# Patient Record
Sex: Female | Born: 1950 | Race: White | Hispanic: No | Marital: Married | Smoking: Never smoker
Health system: Southern US, Community
[De-identification: ages and names within clinical notes are randomized; demographics above are authoritative.]

---

## 2001-05-08 ENCOUNTER — Encounter: Admission: RE | Admit: 2001-05-08 | Discharge: 2001-06-06 | Payer: Self-pay | Admitting: Family Medicine

## 2002-10-02 ENCOUNTER — Ambulatory Visit (HOSPITAL_COMMUNITY): Admission: RE | Admit: 2002-10-02 | Discharge: 2002-10-02 | Payer: Self-pay | Admitting: Gastroenterology

## 2017-04-27 DIAGNOSIS — H5213 Myopia, bilateral: Secondary | ICD-10-CM | POA: Diagnosis not present

## 2017-04-27 DIAGNOSIS — H52229 Regular astigmatism, unspecified eye: Secondary | ICD-10-CM | POA: Diagnosis not present

## 2017-04-27 DIAGNOSIS — H524 Presbyopia: Secondary | ICD-10-CM | POA: Diagnosis not present

## 2017-04-27 DIAGNOSIS — H5203 Hypermetropia, bilateral: Secondary | ICD-10-CM | POA: Diagnosis not present

## 2017-06-01 DIAGNOSIS — R69 Illness, unspecified: Secondary | ICD-10-CM | POA: Diagnosis not present

## 2017-07-29 DIAGNOSIS — Z823 Family history of stroke: Secondary | ICD-10-CM | POA: Diagnosis not present

## 2017-07-29 DIAGNOSIS — Z7722 Contact with and (suspected) exposure to environmental tobacco smoke (acute) (chronic): Secondary | ICD-10-CM | POA: Diagnosis not present

## 2017-07-29 DIAGNOSIS — J309 Allergic rhinitis, unspecified: Secondary | ICD-10-CM | POA: Diagnosis not present

## 2017-07-29 DIAGNOSIS — Z8249 Family history of ischemic heart disease and other diseases of the circulatory system: Secondary | ICD-10-CM | POA: Diagnosis not present

## 2017-07-29 DIAGNOSIS — R030 Elevated blood-pressure reading, without diagnosis of hypertension: Secondary | ICD-10-CM | POA: Diagnosis not present

## 2017-07-29 DIAGNOSIS — K08409 Partial loss of teeth, unspecified cause, unspecified class: Secondary | ICD-10-CM | POA: Diagnosis not present

## 2018-04-26 DIAGNOSIS — H5203 Hypermetropia, bilateral: Secondary | ICD-10-CM | POA: Diagnosis not present

## 2018-04-26 DIAGNOSIS — H5213 Myopia, bilateral: Secondary | ICD-10-CM | POA: Diagnosis not present

## 2018-04-26 DIAGNOSIS — H524 Presbyopia: Secondary | ICD-10-CM | POA: Diagnosis not present

## 2018-04-26 DIAGNOSIS — H52229 Regular astigmatism, unspecified eye: Secondary | ICD-10-CM | POA: Diagnosis not present

## 2018-04-26 DIAGNOSIS — H2510 Age-related nuclear cataract, unspecified eye: Secondary | ICD-10-CM | POA: Diagnosis not present

## 2018-09-18 DIAGNOSIS — M722 Plantar fascial fibromatosis: Secondary | ICD-10-CM | POA: Diagnosis not present

## 2018-09-18 DIAGNOSIS — M7731 Calcaneal spur, right foot: Secondary | ICD-10-CM | POA: Diagnosis not present

## 2018-09-18 DIAGNOSIS — M79671 Pain in right foot: Secondary | ICD-10-CM | POA: Diagnosis not present

## 2018-10-17 DIAGNOSIS — M722 Plantar fascial fibromatosis: Secondary | ICD-10-CM | POA: Diagnosis not present

## 2018-10-17 DIAGNOSIS — M7731 Calcaneal spur, right foot: Secondary | ICD-10-CM | POA: Diagnosis not present

## 2018-10-17 DIAGNOSIS — M79671 Pain in right foot: Secondary | ICD-10-CM | POA: Diagnosis not present

## 2018-11-07 DIAGNOSIS — M7731 Calcaneal spur, right foot: Secondary | ICD-10-CM | POA: Diagnosis not present

## 2018-11-07 DIAGNOSIS — M722 Plantar fascial fibromatosis: Secondary | ICD-10-CM | POA: Diagnosis not present

## 2018-11-07 DIAGNOSIS — M79671 Pain in right foot: Secondary | ICD-10-CM | POA: Diagnosis not present

## 2018-12-12 DIAGNOSIS — M7732 Calcaneal spur, left foot: Secondary | ICD-10-CM | POA: Diagnosis not present

## 2018-12-12 DIAGNOSIS — M79672 Pain in left foot: Secondary | ICD-10-CM | POA: Diagnosis not present

## 2018-12-12 DIAGNOSIS — M722 Plantar fascial fibromatosis: Secondary | ICD-10-CM | POA: Diagnosis not present

## 2018-12-12 DIAGNOSIS — M7731 Calcaneal spur, right foot: Secondary | ICD-10-CM | POA: Diagnosis not present

## 2018-12-12 DIAGNOSIS — M79671 Pain in right foot: Secondary | ICD-10-CM | POA: Diagnosis not present

## 2018-12-14 DIAGNOSIS — M25579 Pain in unspecified ankle and joints of unspecified foot: Secondary | ICD-10-CM | POA: Diagnosis not present

## 2018-12-14 DIAGNOSIS — M7732 Calcaneal spur, left foot: Secondary | ICD-10-CM | POA: Diagnosis not present

## 2018-12-14 DIAGNOSIS — M79671 Pain in right foot: Secondary | ICD-10-CM | POA: Diagnosis not present

## 2018-12-14 DIAGNOSIS — M7731 Calcaneal spur, right foot: Secondary | ICD-10-CM | POA: Diagnosis not present

## 2018-12-14 DIAGNOSIS — M722 Plantar fascial fibromatosis: Secondary | ICD-10-CM | POA: Diagnosis not present

## 2018-12-14 DIAGNOSIS — M79672 Pain in left foot: Secondary | ICD-10-CM | POA: Diagnosis not present

## 2019-01-11 DIAGNOSIS — R69 Illness, unspecified: Secondary | ICD-10-CM | POA: Diagnosis not present

## 2019-02-06 DIAGNOSIS — M7731 Calcaneal spur, right foot: Secondary | ICD-10-CM | POA: Diagnosis not present

## 2019-02-06 DIAGNOSIS — M79671 Pain in right foot: Secondary | ICD-10-CM | POA: Diagnosis not present

## 2019-02-06 DIAGNOSIS — M722 Plantar fascial fibromatosis: Secondary | ICD-10-CM | POA: Diagnosis not present

## 2020-04-07 ENCOUNTER — Other Ambulatory Visit: Payer: Self-pay

## 2020-04-07 ENCOUNTER — Emergency Department (HOSPITAL_COMMUNITY): Payer: Medicare HMO

## 2020-04-07 ENCOUNTER — Encounter (HOSPITAL_COMMUNITY): Payer: Self-pay | Admitting: Emergency Medicine

## 2020-04-07 ENCOUNTER — Emergency Department (HOSPITAL_COMMUNITY)
Admission: EM | Admit: 2020-04-07 | Discharge: 2020-04-07 | Disposition: A | Discharge status: Home / Self Care | Payer: Medicare HMO | Attending: Emergency Medicine | Admitting: Emergency Medicine

## 2020-04-07 DIAGNOSIS — R519 Headache, unspecified: Secondary | ICD-10-CM | POA: Diagnosis not present

## 2020-04-07 DIAGNOSIS — R55 Syncope and collapse: Secondary | ICD-10-CM

## 2020-04-07 DIAGNOSIS — R0981 Nasal congestion: Secondary | ICD-10-CM | POA: Diagnosis not present

## 2020-04-07 DIAGNOSIS — Y92012 Bathroom of single-family (private) house as the place of occurrence of the external cause: Secondary | ICD-10-CM | POA: Diagnosis not present

## 2020-04-07 DIAGNOSIS — Z79899 Other long term (current) drug therapy: Secondary | ICD-10-CM | POA: Insufficient documentation

## 2020-04-07 DIAGNOSIS — Z20822 Contact with and (suspected) exposure to covid-19: Secondary | ICD-10-CM | POA: Insufficient documentation

## 2020-04-07 LAB — BASIC METABOLIC PANEL
Anion gap: 11 (ref 5–15)
BUN: 8 mg/dL (ref 8–23)
CO2: 24 mmol/L (ref 22–32)
Calcium: 9.3 mg/dL (ref 8.9–10.3)
Chloride: 103 mmol/L (ref 98–111)
Creatinine, Ser: 0.74 mg/dL (ref 0.44–1.00)
GFR, Estimated: >60 mL/min (ref >60)
Glucose, Bld: 103 mg/dL — ABNORMAL HIGH (ref 70–99)
Potassium: 3.6 mmol/L (ref 3.5–5.1)
Sodium: 138 mmol/L (ref 135–145)

## 2020-04-07 LAB — CBC
HCT: 42.5 % (ref 36.0–46.0)
Hemoglobin: 13.6 g/dL (ref 12.0–15.0)
MCH: 29.5 pg (ref 26.0–34.0)
MCHC: 32 g/dL (ref 30.0–36.0)
MCV: 92.2 fL (ref 80.0–100.0)
Platelets: 205 10*3/uL (ref 150–400)
RBC: 4.61 MIL/uL (ref 3.87–5.11)
RDW: 13.5 % (ref 11.5–15.5)
WBC: 7.4 10*3/uL (ref 4.0–10.5)
nRBC: 0 % (ref 0.0–0.2)

## 2020-04-07 LAB — SARS CORONAVIRUS 2 (TAT 6-24 HRS): SARS Coronavirus 2: POSITIVE — AB

## 2020-04-07 NOTE — Discharge Instructions (Signed)
As we discussed it was recommended that she be admitted for the unexplained passing out.  At this reoccurs you need to return and need to be reevaluated.  Covid test done.  You will be able to follow-up your results on MyChart.  Return for any passing out return for any new or worse symptoms.

## 2020-04-07 NOTE — ED Provider Notes (Addendum)
[facility] EMERGENCY DEPARTMENT Provider Note   CSN: [id number] Arrival date & time: 04/07/20  7116     History Chief Complaint  Patient presents with  . Loss of Consciousness    Danielle Jensen is a 69 y.o. female.  Patient brought in by POV.  Patient's caregiver.  She felt fine she was up at 4 in the morning no symptoms.  Shortly prior to arrival she walked into the bathroom and's must of passed out because she awoke on the floor.  She denies any complaints now no headache no neck pain no back pain no extremity pain.  No chest pain.  Patient is never had this happen before.  Past medical history noncontributory.  Patient not a smoker.  Patient states yesterday she had a little bit of mild headache and some congestion.  May be slight cough.  But none of that is present today.        History reviewed. No pertinent past medical history.  There are no problems to display for this patient.   History reviewed. No pertinent surgical history.   OB History   No obstetric history on file.     History reviewed. No pertinent family history.  Social History   Tobacco Use  . Smoking status: Never Smoker  . Smokeless tobacco: Never Used  Substance Use Topics  . Alcohol use: Never  . Drug use: Never    Home Medications Prior to Admission medications   Medication Sig Start Date End Date Taking? Authorizing Provider  Ascorbic Acid (VITAMIN C PO) Take 1 tablet by mouth daily.   Yes [provider]  Multiple Vitamins-Minerals (ZINC PO) Take 1 tablet by mouth daily.   Yes [provider]    Allergies    Patient has no allergy information on record.  Review of Systems   Review of Systems  Constitutional: Negative for chills and fever.  HENT: Negative for rhinorrhea and sore throat.   Eyes: Negative for visual disturbance.  Respiratory: Negative for cough and shortness of breath.   Cardiovascular: Negative for chest pain and leg swelling.   Gastrointestinal: Negative for abdominal pain, diarrhea, nausea and vomiting.  Genitourinary: Negative for dysuria.  Musculoskeletal: Negative for back pain and neck pain.  Skin: Negative for rash.  Neurological: Positive for syncope. Negative for dizziness, light-headedness and headaches.  Hematological: Does not bruise/bleed easily.  Psychiatric/Behavioral: Negative for confusion.    Physical Exam Updated Vital Signs BP (!) 147/84   Pulse 84   Temp 98.6 F (37 C) (Oral)   Resp (!) 23   Ht 1.575 m (5\' 2" )   Wt 56.2 kg   SpO2 97%   BMI 22.68 kg/m   Physical Exam Vitals and nursing note reviewed.  Constitutional:      General: She is not in acute distress.    Appearance: Normal appearance. She is well-developed and well-nourished.  HENT:     Head: Normocephalic and atraumatic.  Eyes:     Extraocular Movements: Extraocular movements intact.     Conjunctiva/sclera: Conjunctivae normal.     Pupils: Pupils are equal, round, and reactive to light.  Cardiovascular:     Rate and Rhythm: Normal rate and regular rhythm.     Heart sounds: No murmur heard.   Pulmonary:     Effort: Pulmonary effort is normal. No respiratory distress.     Breath sounds: Normal breath sounds.  Abdominal:     Palpations: Abdomen is soft.     Tenderness: There is  no abdominal tenderness.  Musculoskeletal:        General: No edema. Normal range of motion.     Cervical back: Normal range of motion and neck supple.  Skin:    General: Skin is warm and dry.     Capillary Refill: Capillary refill takes less than 2 seconds.  Neurological:     General: No focal deficit present.     Mental Status: She is alert and oriented to person, place, and time.     Cranial Nerves: No cranial nerve deficit.     Sensory: No sensory deficit.     Motor: No weakness.  Psychiatric:        Mood and Affect: Mood and affect normal.     ED Results / Procedures / Treatments   Labs (all labs ordered are listed, but  only abnormal results are displayed) Labs Reviewed  BASIC METABOLIC PANEL - Abnormal; Notable for the following components:      Result Value   Glucose, Bld 103 (*)    All other components within normal limits  SARS CORONAVIRUS 2 (TAT 6-24 HRS)  CBC  URINALYSIS, ROUTINE W REFLEX MICROSCOPIC    EKG EKG Interpretation  Date/Time:  Monday April 07 2020 08:27:12 EST Ventricular Rate:  84 PR Interval:    QRS Duration: 101 QT Interval:  366 QTC Calculation: 433 R Axis:   3 Text Interpretation: Sinus rhythm Probable left atrial enlargement Abnormal R-wave progression, early transition Borderline T abnormalities, anterior leads No previous ECGs available Confirmed by Vanetta Mulders [phone]) on 04/07/2020 8:29:50 AM   Radiology DG Chest Port 1 View  Result Date: 04/07/2020 CLINICAL DATA:  Syncope EXAM: PORTABLE CHEST 1 VIEW COMPARISON:  None. FINDINGS: Normal heart size. Normal mediastinal contour. No pneumothorax. No pleural effusion. Lungs appear clear, with no acute consolidative airspace disease and no pulmonary edema. IMPRESSION: No active disease. Electronically Signed   By: Delbert Phenix M.D.   On: 04/07/2020 08:58    Procedures Procedures (including critical care time)  Medications Ordered in ED Medications - No data to display  ED Course  I have reviewed the triage vital signs and the nursing notes.  Pertinent labs & imaging results that were available during my care of the patient were reviewed by me and considered in my medical decision making (see chart for details).    MDM Rules/Calculators/A&P                          Patient with unexplained syncope.  Neuro exam here normal.  Chest x-ray negative.  Cardiac monitoring without evidence of any arrhythmias.  Basic labs no anemia no significant electrolyte abnormalities.  Recommended admission for cardiac monitoring due to unexplained syncope.  But patient refuses.  She does understand where the concern is that the  heart may have gone into an abnormal rhythm resulting in her passing out since she had no prewarning about passing out.  Patient is requesting Covid test so he does not have any significant symptoms.  Yesterday she had some congestion.  Had a mild headache.  I have ordered the 6-hour Covid test for her.  She can follow-up through MyChart.  Again patient understands the concern with unexplained syncope and the chance of cardiac arrhythmia.  She still refuses admission.  Seems to understand the risks.  Final Clinical Impression(s) / ED Diagnoses Final diagnoses:  Syncope, unspecified syncope type    Rx / DC Orders ED Discharge Orders  None       Vanetta Mulders, MD 04/07/20 1015    Vanetta Mulders, MD 04/07/20 1016

## 2020-04-07 NOTE — ED Triage Notes (Signed)
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 C/o of headache. Unsure if she hit her head.

## 2020-09-29 ENCOUNTER — Emergency Department (HOSPITAL_COMMUNITY)
Admission: EM | Admit: 2020-09-29 | Discharge: 2020-09-30 | Disposition: A | Discharge status: Home / Self Care | Payer: Medicare HMO | Attending: Emergency Medicine | Admitting: Emergency Medicine

## 2020-09-29 ENCOUNTER — Other Ambulatory Visit: Payer: Self-pay

## 2020-09-29 ENCOUNTER — Encounter (HOSPITAL_COMMUNITY): Payer: Self-pay | Admitting: Emergency Medicine

## 2020-09-29 DIAGNOSIS — R509 Fever, unspecified: Secondary | ICD-10-CM | POA: Insufficient documentation

## 2020-09-29 DIAGNOSIS — R0981 Nasal congestion: Secondary | ICD-10-CM | POA: Diagnosis not present

## 2020-09-29 DIAGNOSIS — Z5321 Procedure and treatment not carried out due to patient leaving prior to being seen by health care provider: Secondary | ICD-10-CM | POA: Insufficient documentation

## 2020-09-29 NOTE — ED Triage Notes (Signed)
Pt with sinus pain and nasal congestion since yesterday but states that tonight she "got a charlie horse under her ribs on both sides".

## 2020-09-30 DIAGNOSIS — R059 Cough, unspecified: Secondary | ICD-10-CM | POA: Diagnosis not present

## 2020-09-30 DIAGNOSIS — J3489 Other specified disorders of nose and nasal sinuses: Secondary | ICD-10-CM | POA: Diagnosis not present

## 2020-09-30 DIAGNOSIS — R0981 Nasal congestion: Secondary | ICD-10-CM | POA: Diagnosis not present

## 2020-09-30 DIAGNOSIS — H9209 Otalgia, unspecified ear: Secondary | ICD-10-CM | POA: Diagnosis not present

## 2020-09-30 DIAGNOSIS — J329 Chronic sinusitis, unspecified: Secondary | ICD-10-CM | POA: Diagnosis not present

## 2021-04-01 DIAGNOSIS — Z8249 Family history of ischemic heart disease and other diseases of the circulatory system: Secondary | ICD-10-CM | POA: Diagnosis not present

## 2021-04-01 DIAGNOSIS — R030 Elevated blood-pressure reading, without diagnosis of hypertension: Secondary | ICD-10-CM | POA: Diagnosis not present

## 2021-04-01 DIAGNOSIS — J309 Allergic rhinitis, unspecified: Secondary | ICD-10-CM | POA: Diagnosis not present

## 2021-04-01 DIAGNOSIS — Z811 Family history of alcohol abuse and dependence: Secondary | ICD-10-CM | POA: Diagnosis not present

## 2021-04-01 DIAGNOSIS — R69 Illness, unspecified: Secondary | ICD-10-CM | POA: Diagnosis not present

## 2021-04-01 DIAGNOSIS — Z823 Family history of stroke: Secondary | ICD-10-CM | POA: Diagnosis not present

## 2022-12-12 IMAGING — DX DG CHEST 1V PORT
1 series · 1 of 1 positions shown · non-contrast
Comparison: None.

CLINICAL DATA: Syncope

EXAM:
PORTABLE CHEST 1 VIEW

[chest ap]
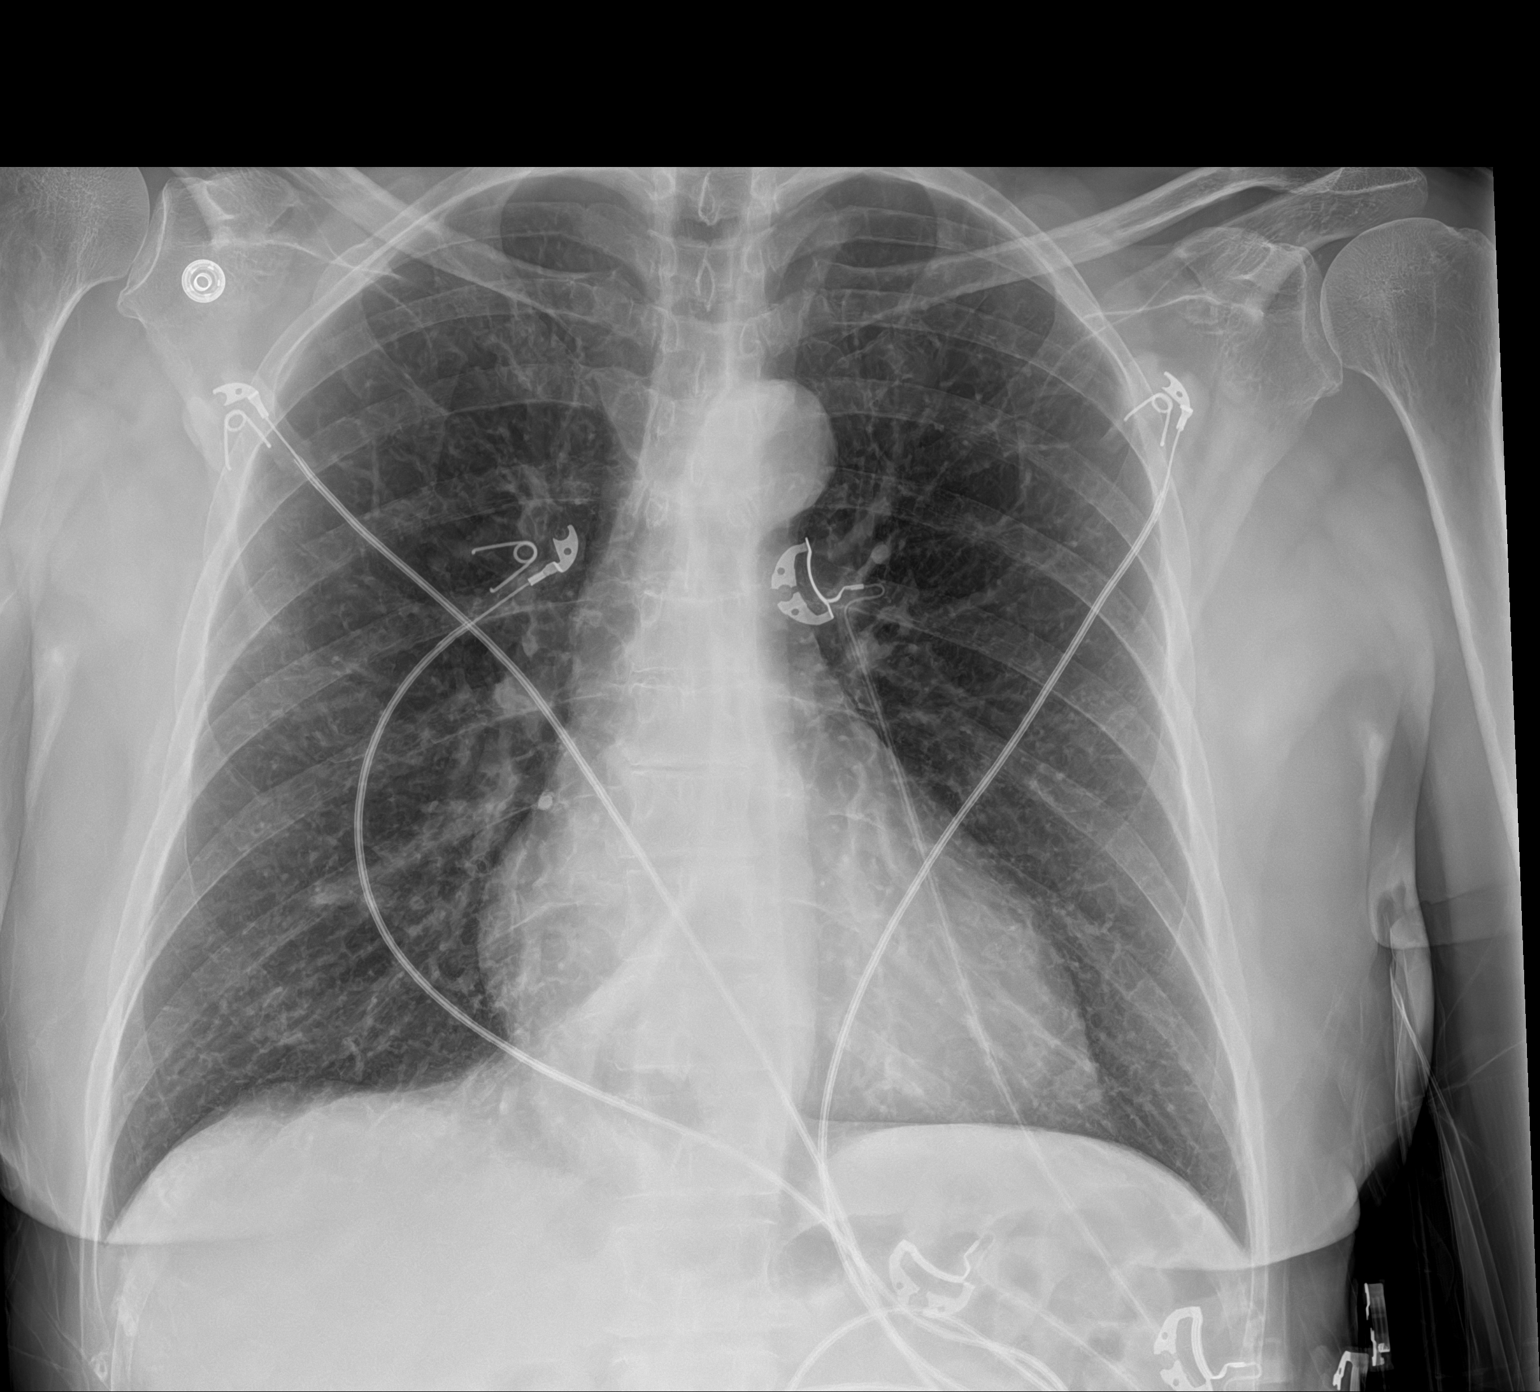

[1 of 1 positions shown; findings below may reference images not displayed]

FINDINGS: Normal heart size. Normal mediastinal contour. No pneumothorax. No
pleural effusion. Lungs appear clear, with no acute consolidative
airspace disease and no pulmonary edema.
IMPRESSION: No active disease.

## 2023-01-05 DIAGNOSIS — Z0100 Encounter for examination of eyes and vision without abnormal findings: Secondary | ICD-10-CM | POA: Diagnosis not present

## 2023-01-05 DIAGNOSIS — H524 Presbyopia: Secondary | ICD-10-CM | POA: Diagnosis not present

## 2023-03-22 DIAGNOSIS — J309 Allergic rhinitis, unspecified: Secondary | ICD-10-CM | POA: Diagnosis not present

## 2023-03-22 DIAGNOSIS — Z008 Encounter for other general examination: Secondary | ICD-10-CM | POA: Diagnosis not present

## 2023-03-22 DIAGNOSIS — H269 Unspecified cataract: Secondary | ICD-10-CM | POA: Diagnosis not present

## 2023-03-22 DIAGNOSIS — Z91199 Patient's noncompliance with other medical treatment and regimen due to unspecified reason: Secondary | ICD-10-CM | POA: Diagnosis not present

## 2023-03-22 DIAGNOSIS — Z8249 Family history of ischemic heart disease and other diseases of the circulatory system: Secondary | ICD-10-CM | POA: Diagnosis not present

## 2023-03-22 DIAGNOSIS — Z823 Family history of stroke: Secondary | ICD-10-CM | POA: Diagnosis not present

## 2023-03-22 DIAGNOSIS — I10 Essential (primary) hypertension: Secondary | ICD-10-CM | POA: Diagnosis not present

## 2023-03-22 DIAGNOSIS — K056 Periodontal disease, unspecified: Secondary | ICD-10-CM | POA: Diagnosis not present
